# Patient Record
Sex: Female | Born: 1995 | Race: White | Hispanic: No | Marital: Single | Smoking: Never smoker
Health system: Southern US, Community
[De-identification: ages and names within clinical notes are randomized; demographics above are authoritative.]

## PROBLEM LIST (undated history)

## (undated) HISTORY — PX: TONSILLECTOMY: SUR1361

## (undated) HISTORY — PX: TYMPANOSTOMY TUBE PLACEMENT: SHX32

---
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
## 2017-01-05 ENCOUNTER — Encounter (HOSPITAL_BASED_OUTPATIENT_CLINIC_OR_DEPARTMENT_OTHER): Payer: Self-pay | Admitting: *Deleted

## 2017-01-05 ENCOUNTER — Emergency Department (HOSPITAL_BASED_OUTPATIENT_CLINIC_OR_DEPARTMENT_OTHER): Payer: BLUE CROSS/BLUE SHIELD

## 2017-01-05 ENCOUNTER — Emergency Department (HOSPITAL_BASED_OUTPATIENT_CLINIC_OR_DEPARTMENT_OTHER)
Admission: EM | Admit: 2017-01-05 | Discharge: 2017-01-05 | Disposition: A | Discharge status: Home / Self Care | Payer: BLUE CROSS/BLUE SHIELD | Attending: Emergency Medicine | Admitting: Emergency Medicine

## 2017-01-05 DIAGNOSIS — R350 Frequency of micturition: Secondary | ICD-10-CM | POA: Diagnosis not present

## 2017-01-05 DIAGNOSIS — R112 Nausea with vomiting, unspecified: Secondary | ICD-10-CM | POA: Insufficient documentation

## 2017-01-05 DIAGNOSIS — N200 Calculus of kidney: Secondary | ICD-10-CM | POA: Insufficient documentation

## 2017-01-05 DIAGNOSIS — M545 Low back pain: Secondary | ICD-10-CM | POA: Insufficient documentation

## 2017-01-05 DIAGNOSIS — R1031 Right lower quadrant pain: Secondary | ICD-10-CM | POA: Diagnosis present

## 2017-01-05 LAB — COMPREHENSIVE METABOLIC PANEL
ALT: 18 U/L (ref 14–54)
AST: 23 U/L (ref 15–41)
Albumin: 4.3 g/dL (ref 3.5–5.0)
Alkaline Phosphatase: 66 U/L (ref 38–126)
Anion gap: 14 (ref 5–15)
BUN: 12 mg/dL (ref 6–20)
CHLORIDE: 107 mmol/L (ref 101–111)
CO2: 19 mmol/L — AB (ref 22–32)
CREATININE: 0.93 mg/dL (ref 0.44–1.00)
Calcium: 9.4 mg/dL (ref 8.9–10.3)
GFR calc Af Amer: >60 mL/min (ref >60)
GLUCOSE: 119 mg/dL — AB (ref 65–99)
Potassium: 3.6 mmol/L (ref 3.5–5.1)
SODIUM: 140 mmol/L (ref 135–145)
Total Bilirubin: 0.4 mg/dL (ref 0.3–1.2)
Total Protein: 7.6 g/dL (ref 6.5–8.1)

## 2017-01-05 LAB — CBC WITH DIFFERENTIAL/PLATELET
Basophils Absolute: 0 10*3/uL (ref 0.0–0.1)
Basophils Relative: 0 %
EOS ABS: 0 10*3/uL (ref 0.0–0.7)
EOS PCT: 0 %
HCT: 37.7 % (ref 36.0–46.0)
Hemoglobin: 13 g/dL (ref 12.0–15.0)
LYMPHS ABS: 1.5 10*3/uL (ref 0.7–4.0)
Lymphocytes Relative: 14 %
MCH: 29.1 pg (ref 26.0–34.0)
MCHC: 34.5 g/dL (ref 30.0–36.0)
MCV: 84.3 fL (ref 78.0–100.0)
MONO ABS: 0.5 10*3/uL (ref 0.1–1.0)
MONOS PCT: 5 %
Neutro Abs: 8.5 10*3/uL — ABNORMAL HIGH (ref 1.7–7.7)
Neutrophils Relative %: 81 %
PLATELETS: 289 10*3/uL (ref 150–400)
RBC: 4.47 MIL/uL (ref 3.87–5.11)
RDW: 12.9 % (ref 11.5–15.5)
WBC: 10.5 10*3/uL (ref 4.0–10.5)

## 2017-01-05 LAB — URINALYSIS, ROUTINE W REFLEX MICROSCOPIC
Bilirubin Urine: NEGATIVE
Glucose, UA: NEGATIVE mg/dL
Ketones, ur: 15 mg/dL — AB
Leukocytes, UA: NEGATIVE
Nitrite: NEGATIVE
Protein, ur: NEGATIVE mg/dL
SPECIFIC GRAVITY, URINE: 1.029 (ref 1.005–1.030)
pH: 5.5 (ref 5.0–8.0)

## 2017-01-05 LAB — URINALYSIS, MICROSCOPIC (REFLEX)

## 2017-01-05 LAB — LIPASE, BLOOD: LIPASE: 20 U/L (ref 11–51)

## 2017-01-05 LAB — PREGNANCY, URINE: Preg Test, Ur: NEGATIVE

## 2017-01-05 MED ORDER — SODIUM CHLORIDE 0.9 % IV BOLUS (SEPSIS)
1000.0000 mL | Freq: Once | INTRAVENOUS | Status: AC
  Administered 2017-01-05: 1000 mL via INTRAVENOUS

## 2017-01-05 MED ORDER — ONDANSETRON HCL 4 MG/2ML IJ SOLN
INTRAMUSCULAR | Status: AC
  Administered 2017-01-05: 4 mg via INTRAVENOUS
  Filled 2017-01-05: qty 2

## 2017-01-05 MED ORDER — ONDANSETRON 4 MG PO TBDP
4.0000 mg | ORAL_TABLET | Freq: Once | ORAL | Status: AC
  Administered 2017-01-05: 4 mg via ORAL
  Filled 2017-01-05: qty 1

## 2017-01-05 MED ORDER — ONDANSETRON 8 MG PO TBDP: 8.0000 mg | ORAL_TABLET | Freq: Three times a day (TID) | ORAL | 0 refills | Status: AC | PRN

## 2017-01-05 MED ORDER — KETOROLAC TROMETHAMINE 30 MG/ML IJ SOLN
30.0000 mg | Freq: Once | INTRAMUSCULAR | Status: AC
  Administered 2017-01-05: 30 mg via INTRAVENOUS
  Filled 2017-01-05: qty 1

## 2017-01-05 MED ORDER — IOPAMIDOL (ISOVUE-300) INJECTION 61%
100.0000 mL | Freq: Once | INTRAVENOUS | Status: AC | PRN
  Administered 2017-01-05: 100 mL via INTRAVENOUS

## 2017-01-05 MED ORDER — OXYCODONE-ACETAMINOPHEN 5-325 MG PO TABS
2.0000 | ORAL_TABLET | Freq: Once | ORAL | Status: AC
  Administered 2017-01-05: 2 via ORAL
  Filled 2017-01-05: qty 2

## 2017-01-05 MED ORDER — OXYCODONE-ACETAMINOPHEN 5-325 MG PO TABS: 1.0000 | ORAL_TABLET | Freq: Once | ORAL | Status: DC

## 2017-01-05 MED ORDER — ONDANSETRON 4 MG PO TBDP: 4.0000 mg | ORAL_TABLET | Freq: Once | ORAL | Status: DC

## 2017-01-05 MED ORDER — ONDANSETRON HCL 4 MG/2ML IJ SOLN
4.0000 mg | Freq: Once | INTRAMUSCULAR | Status: AC
  Administered 2017-01-05: 4 mg via INTRAVENOUS

## 2017-01-05 MED ORDER — OXYCODONE-ACETAMINOPHEN 5-325 MG PO TABS: 2.0000 | ORAL_TABLET | ORAL | 0 refills | Status: AC | PRN

## 2017-01-05 MED ORDER — MORPHINE SULFATE (PF) 4 MG/ML IV SOLN
4.0000 mg | Freq: Once | INTRAVENOUS | Status: AC
  Administered 2017-01-05: 4 mg via INTRAVENOUS
  Filled 2017-01-05: qty 1

## 2017-01-05 MED ORDER — MORPHINE SULFATE (PF) 4 MG/ML IV SOLN: 4.0000 mg | Freq: Once | INTRAVENOUS | Status: DC

## 2017-01-05 MED ORDER — FENTANYL CITRATE (PF) 100 MCG/2ML IJ SOLN
INTRAMUSCULAR | Status: AC
  Administered 2017-01-05: 50 ug
  Filled 2017-01-05: qty 2

## 2017-01-05 NOTE — Discharge Instructions (Signed)
Drink plenty of water to stay well-hydrated. May use ibuprofen, naproxen, or Tylenol for pain. Percocet for severe pain. Do not drive or perform other dangerous activities while taking the Percocet. Zofran for nausea/vomiting. Follow up with urology as soon as possible on this matter. Call the number provided to set up an appointment. Should symptoms worsen or pain or vomiting is unable to be controlled, please proceed to the emergency department at [facility].

## 2017-01-05 NOTE — ED Triage Notes (Signed)
Pt c/o right flank pain n/v  x 1 day

## 2017-01-05 NOTE — ED Provider Notes (Signed)
[facility]   CSN: [id number] Arrival date & time: 01/05/17  1734  By signing my name below, I, Carmen Evans, attest that this documentation has been prepared under the direction and in the presence of Lakota Markgraf, PA-C. Electronically Signed: Thelma Evans, Scribe. 01/05/17. 6:22 PM.  History   Chief Complaint Chief Complaint  Patient presents with  . Flank Pain   The history is provided by the patient. No language interpreter was used.    HPI Comments: Carmen Evans is a 21 y.o. female who presents to the Emergency Department complaining of constant, gradually worsening, sharp right-sided right lower back pain that began around 9AM this morning. She states the pain is 7/10 and radiates into her right flank and lower right and central abdomen. She has associated nausea, vomiting, urinary frequency, and a feeling of "needles in the vaginal region." She has had similar, but less intense, symptoms with a previous kidney stone. Denies dysuria, hematuria, diarrhea, fever, or any other complaints.   History reviewed. No pertinent past medical history.  There are no active problems to display for this patient.   Past Surgical History:  Procedure Laterality Date  . TONSILLECTOMY    . TYMPANOSTOMY TUBE PLACEMENT      OB History    No data available       Home Medications    Prior to Admission medications   Medication Sig Start Date End Date Taking? Authorizing Provider  ondansetron (ZOFRAN-ODT) 8 MG disintegrating tablet Take 1 tablet (8 mg total) by mouth every 8 (eight) hours as needed for nausea or vomiting. 01/05/17   Jatia Musa C, PA-C  oxyCODONE-acetaminophen (PERCOCET/ROXICET) 5-325 MG tablet Take 2 tablets by mouth every 4 (four) hours as needed for severe pain. 01/05/17 01/10/17  Anselm Pancoast, PA-C    Family History No family history on file.  Social History Social History  Substance Use Topics  . Smoking status: Never Smoker  . Smokeless  tobacco: Not on file  . Alcohol use No     Allergies   Patient has no known allergies.   Review of Systems Review of Systems  Constitutional: Negative for chills, diaphoresis and fever.  Gastrointestinal: Positive for abdominal pain, nausea and vomiting. Negative for diarrhea.  Genitourinary: Positive for flank pain and frequency. Negative for dysuria and hematuria.  All other systems reviewed and are negative.    Physical Exam Updated Vital Signs BP 117/89   Pulse (!) 106   Resp 16   Ht 5\' 1"  (1.549 m)   Wt 165 lb (74.8 kg)   LMP 12/06/2016   SpO2 100%   BMI 31.18 kg/m   Physical Exam  Constitutional: She appears well-developed and well-nourished. She appears distressed.  Patient frequently changes positions in an attempt to get comfortable. She shows signs of clearly being in pain.  HENT:  Head: Normocephalic and atraumatic.  Eyes: Conjunctivae are normal.  Neck: Neck supple.  Cardiovascular: Normal rate, regular rhythm, normal heart sounds and intact distal pulses.   Pulmonary/Chest: Effort normal and breath sounds normal. No respiratory distress.  Abdominal: Soft. There is tenderness in the right lower quadrant and periumbilical area. There is CVA tenderness (right). There is no guarding.  Right flank tenderness  Musculoskeletal: She exhibits no edema.  Lymphadenopathy:    She has no cervical adenopathy.  Neurological: She is alert.  Skin: Skin is warm and dry. She is not diaphoretic.  Psychiatric: She has a normal mood and affect. Her behavior is normal.  Nursing note and vitals reviewed.    ED Treatments / Results  DIAGNOSTIC STUDIES: Oxygen Saturation is 100% on RA, normal by my interpretation.    COORDINATION OF CARE: 6:37 PM Discussed treatment plan with pt at bedside and pt agreed to plan.  Labs (all labs ordered are listed, but only abnormal results are displayed) Labs Reviewed  URINALYSIS, ROUTINE W REFLEX MICROSCOPIC - Abnormal; Notable for  the following:       Result Value   APPearance CLOUDY (*)    Hgb urine dipstick LARGE (*)    Ketones, ur 15 (*)    All other components within normal limits  COMPREHENSIVE METABOLIC PANEL - Abnormal; Notable for the following:    CO2 19 (*)    Glucose, Bld 119 (*)    All other components within normal limits  CBC WITH DIFFERENTIAL/PLATELET - Abnormal; Notable for the following:    Neutro Abs 8.5 (*)    All other components within normal limits  URINALYSIS, MICROSCOPIC (REFLEX) - Abnormal; Notable for the following:    Bacteria, UA FEW (*)    Squamous Epithelial / LPF 6-30 (*)    All other components within normal limits  PREGNANCY, URINE  LIPASE, BLOOD    EKG  EKG Interpretation None       Radiology Ct Abdomen Pelvis W Contrast  Result Date: 01/05/2017 CLINICAL DATA:  Right lower quadrant pain for 1 day EXAM: CT ABDOMEN AND PELVIS WITH CONTRAST TECHNIQUE: Multidetector CT imaging of the abdomen and pelvis was performed using the standard protocol following bolus administration of intravenous contrast. CONTRAST:  100mL ISOVUE-300 IOPAMIDOL (ISOVUE-300) INJECTION 61% COMPARISON:  None. FINDINGS: Lower chest: No acute abnormality. Hepatobiliary: No focal liver abnormality is seen. No gallstones, gallbladder wall thickening, or biliary dilatation. Pancreas: Unremarkable. No pancreatic ductal dilatation or surrounding inflammatory changes. Spleen: Normal in size without focal abnormality. Adrenals/Urinary Tract: The adrenal glands are normal. There is delayed enhancement of the right kidney relative to the left kidney. There is right hydroureteronephrosis. There is a 2 mm calcific density identified in the right pelvis, which may represent a distal ureteral stone. There is no left hydronephrosis. The bladder is normal. Stomach/Bowel: Stomach is within normal limits. Appendix appears normal. No evidence of bowel wall thickening, distention, or inflammatory changes. Vascular/Lymphatic: No  significant vascular findings are present. No enlarged abdominal or pelvic lymph nodes. Reproductive: Uterus and bilateral adnexa are unremarkable. Other: No abdominal wall hernia or abnormality. No abdominopelvic ascites. Musculoskeletal: No acute or significant osseous findings. IMPRESSION: Right hydroureteronephrosis with delayed enhancement of the right kidney relative to the left kidney consistent with obstruction. There is a 2 mm calcific density identified in the right pelvis which may represent a distal ureteral stone. The appendix is normal. Electronically Signed   By: Sherian ReinWei-Chen  Lin M.D.   On: 01/05/2017 20:59    Procedures Procedures (including critical care time)  Medications Ordered in ED Medications  ondansetron (ZOFRAN-ODT) disintegrating tablet 4 mg (4 mg Oral Given 01/05/17 1744)  fentaNYL (SUBLIMAZE) 100 MCG/2ML injection (50 mcg  Given 01/05/17 1758)  sodium chloride 0.9 % bolus 1,000 mL (0 mLs Intravenous Stopped 01/05/17 1902)  morphine 4 MG/ML injection 4 mg (4 mg Intravenous Given 01/05/17 1818)  ondansetron (ZOFRAN) injection 4 mg (4 mg Intravenous Given 01/05/17 1818)  ketorolac (TORADOL) 30 MG/ML injection 30 mg (30 mg Intravenous Given 01/05/17 1902)  morphine 4 MG/ML injection 4 mg (4 mg Intravenous Given 01/05/17 1947)  iopamidol (ISOVUE-300) 61 % injection 100 mL (100  mLs Intravenous Contrast Given 01/05/17 2045)  oxyCODONE-acetaminophen (PERCOCET/ROXICET) 5-325 MG per tablet 2 tablet (2 tablets Oral Given 01/05/17 2201)     Initial Impression / Assessment and Plan / ED Course  I have reviewed the triage vital signs and the nursing notes.  Pertinent labs & imaging results that were available during my care of the patient were reviewed by me and considered in my medical decision making (see chart for details).  Clinical Course as of Jan 06 200  Thu Jan 05, 2017  1829 States her pain has improved to about a 5/10 following the first dose of morphine.   [SJ]  [id number] Patient  rates her pain as "an annoying 5/10."   [SJ]  2149 Discussed imaging results and plan of care with patient. States her pain was relieved, but has now returned to a 5/10. She vomited 20 minutes ago.  [SJ]  2250 Patient's pain is moderately controlled with oral medications. She does have some intermittent vomiting. Patient is very much opposed to admission. Shared decision making used regarding admission. States she would like to go home with strict return precautions. [facility] should pain or vomiting become unbearable.   [SJ]    Clinical Course User Index [SJ] Shamarra Warda C, PA-C    Patient presents for complaint of right lower back, right flank, and abdominal pain. Renal stone highly likely, however, due to the patient's area of tenderness on exam, I thought it prudent to rule out appendicitis as well. Appendix normal on CT, but shows 2 mm obstructing stone in distal ureter without signs of infection. Creatinine normal. Attempted to page the urology twice with no response. Urology resident coverage. Was going to notify urology of patient's close office follow up.  The patient was given instructions for home care as well as return precautions. Patient voices understanding of these instructions, accepts the plan, and is comfortable with discharge.  Findings and plan of care discussed with Tilden FossaElizabeth Rees, MD.   Final Clinical Impressions(s) / ED Diagnoses   Final diagnoses:  Kidney stone    New Prescriptions Discharge Medication List as of 01/05/2017 11:01 PM    START taking these medications   Details  ondansetron (ZOFRAN-ODT) 8 MG disintegrating tablet Take 1 tablet (8 mg total) by mouth every 8 (eight) hours as needed for nausea or vomiting., Starting Thu 01/05/2017, Print    oxyCODONE-acetaminophen (PERCOCET/ROXICET) 5-325 MG tablet Take 2 tablets by mouth every 4 (four) hours as needed for severe pain., Starting Thu 01/05/2017, Until Tue 01/10/2017, Print      I  personally performed the services described in this documentation, which was scribed in my presence. The recorded information has been reviewed and is accurate.   Concepcion LivingJoy, Santanna Olenik C, PA-C 01/06/17 0202    Tilden Fossaees, Elizabeth, MD 01/06/17 [id number]

## 2017-01-05 NOTE — ED Notes (Signed)
ED Provider at bedside. 

## 2019-02-21 IMAGING — CT CT ABD-PELV W/ CM
2 of 4 series · 16 of 46 positions shown, 18 images · IV contrast (APPLIED)
Comparison: None.

CLINICAL DATA: Right lower quadrant pain for 1 day

EXAM:
CT ABDOMEN AND PELVIS WITH CONTRAST
TECHNIQUE: Multidetector CT imaging of the abdomen and pelvis was performed
using the standard protocol following bolus administration of
intravenous contrast.
CONTRAST:  100mL A1PVZ2-M00 IOPAMIDOL (A1PVZ2-M00) INJECTION 61%

[Series 2: axial st · axial · 0.91mm/px · z∈[-443,-18]mm · 13 of 93 slices shown, 15 images]
[im 4/93  soft-tissue]
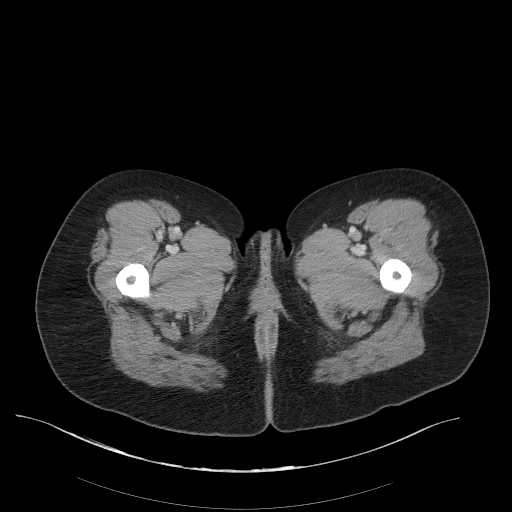
[im 4/93  bone]
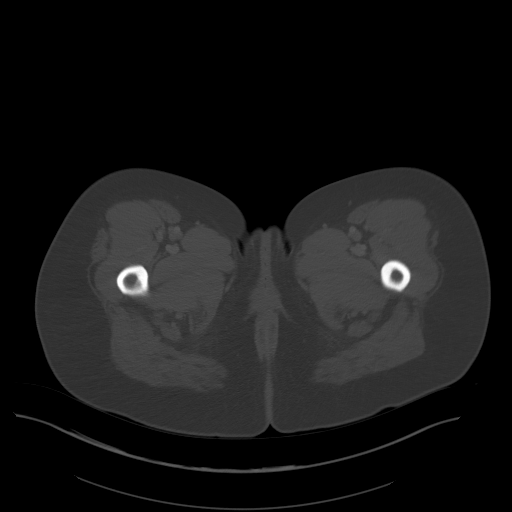
[im 12/93  soft-tissue]
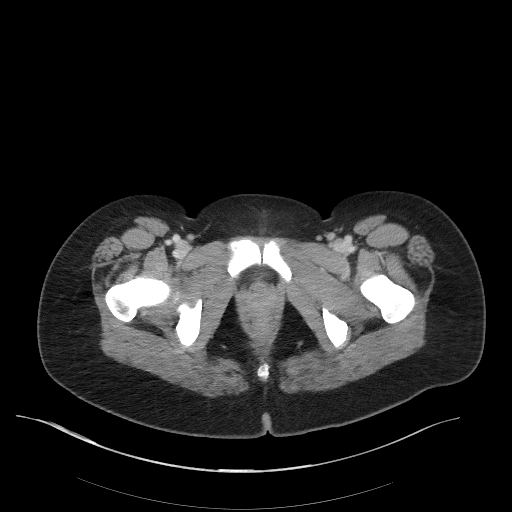
[im 20/93  soft-tissue]
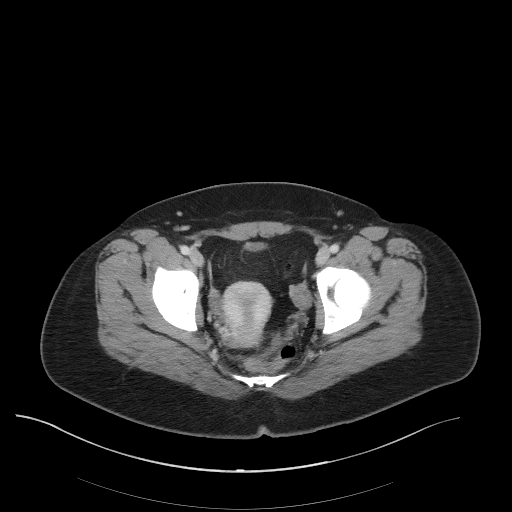
[im 27/93  soft-tissue]
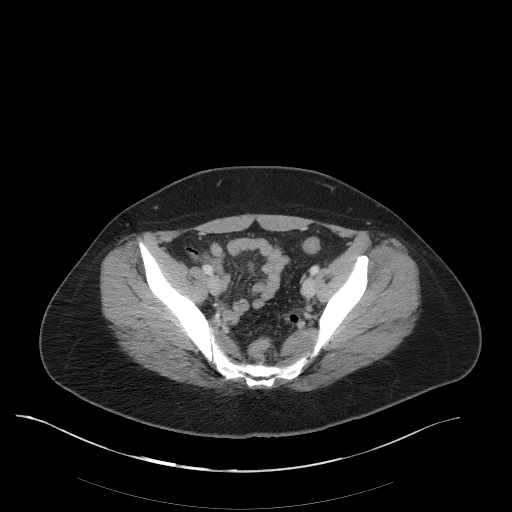
[im 31/93  soft-tissue]
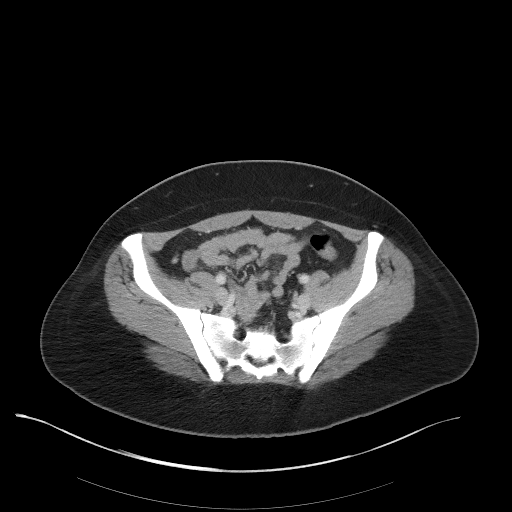
[im 39/93  soft-tissue]
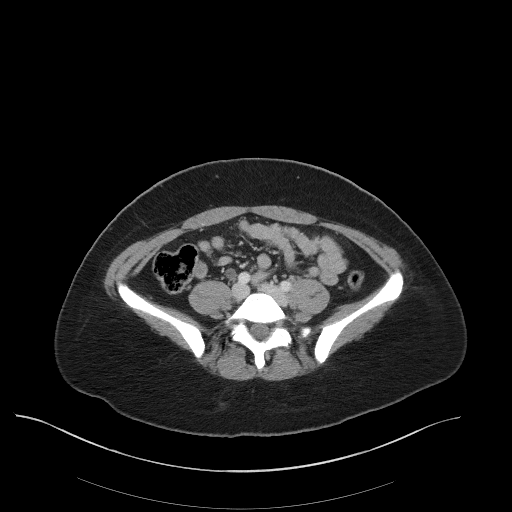
[im 47/93  soft-tissue]
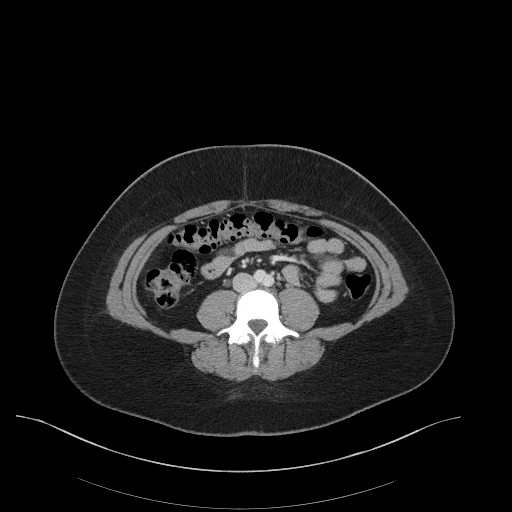
[im 54/93  soft-tissue]
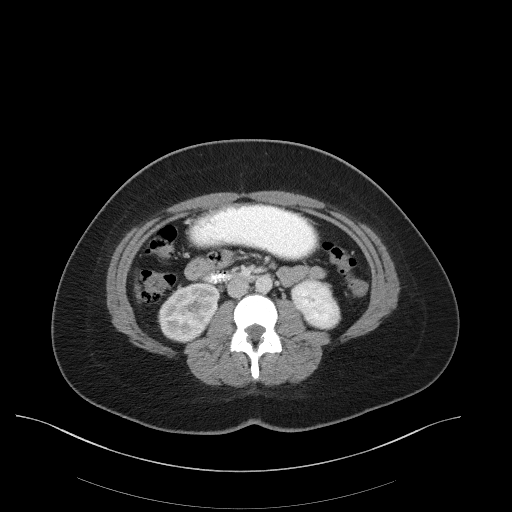
[im 62/93  soft-tissue]
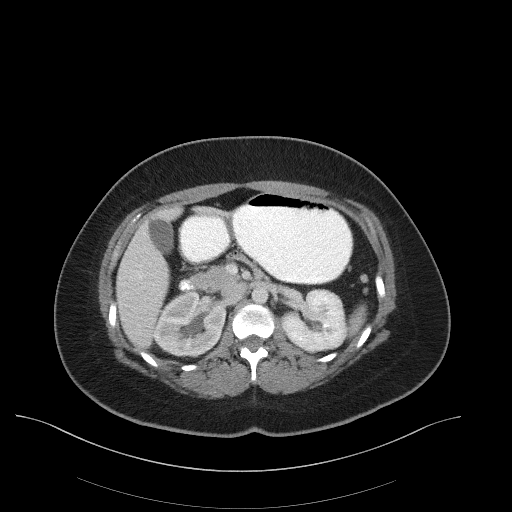
[im 62/93  bone]
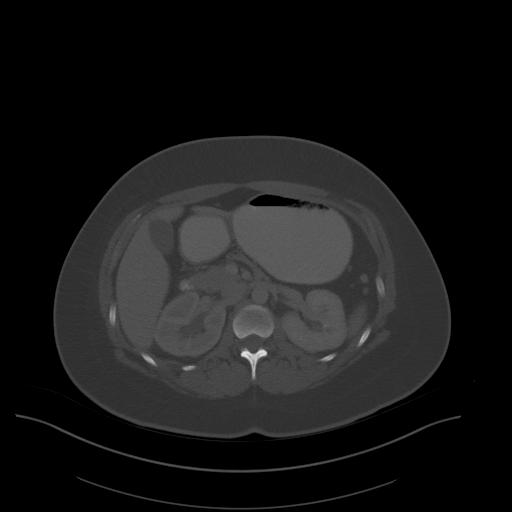
[im 66/93  soft-tissue]
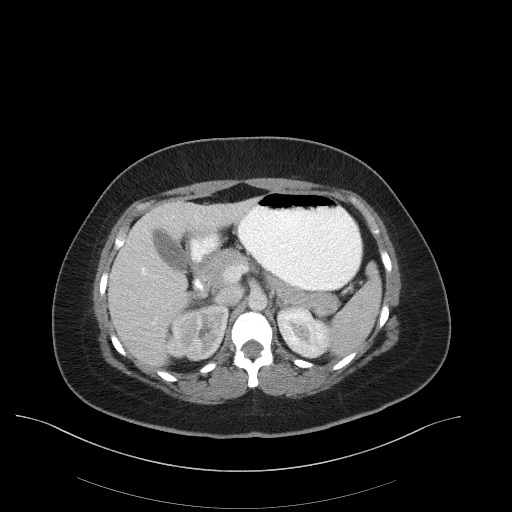
[im 73/93  soft-tissue]
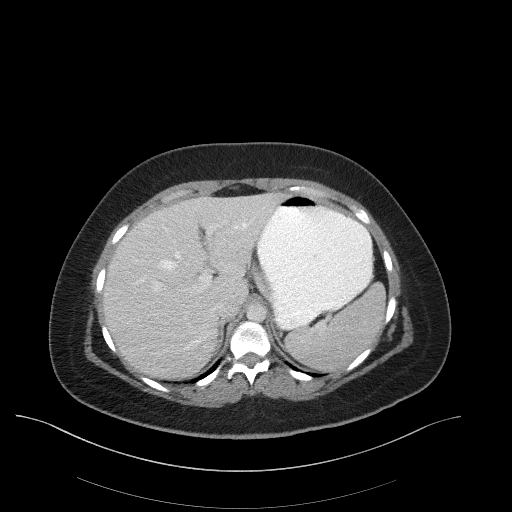
[im 81/93  soft-tissue]
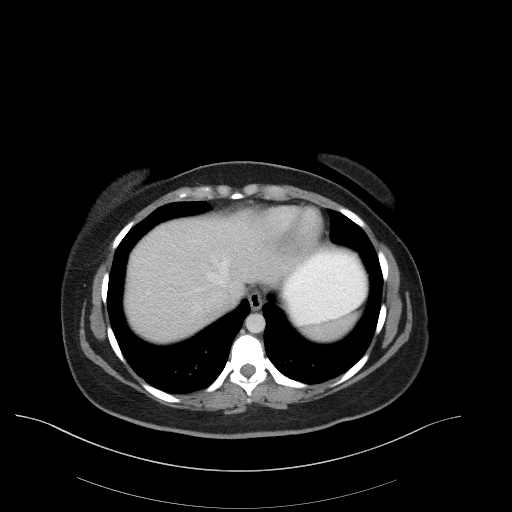
[im 89/93  soft-tissue]
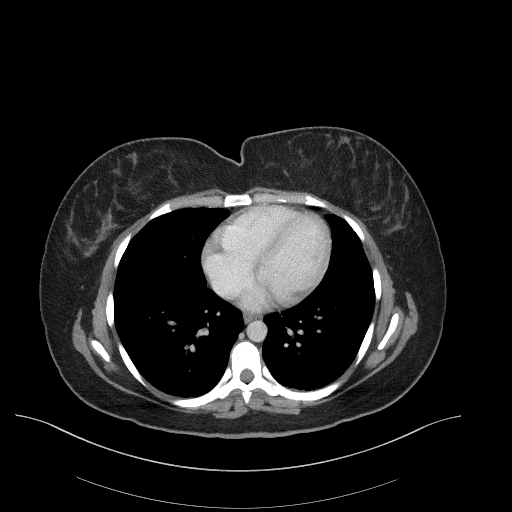

[Series 5: coronal st · coronal · 0.84mm/px · 3 of 88 slices shown]
[im 30/88  soft-tissue]
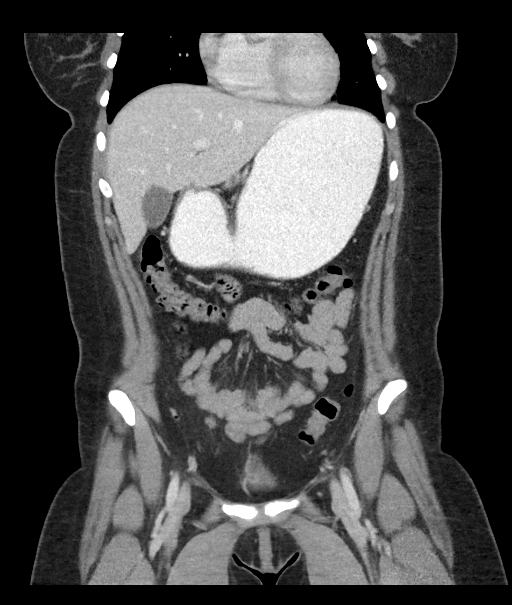
[im 39/88  soft-tissue]
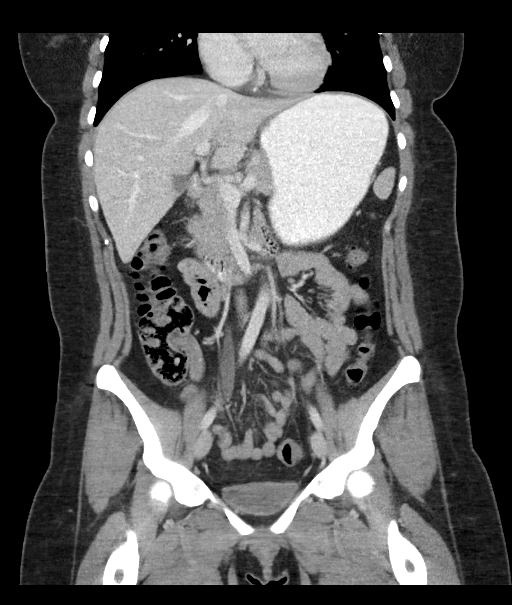
[im 49/88  soft-tissue]
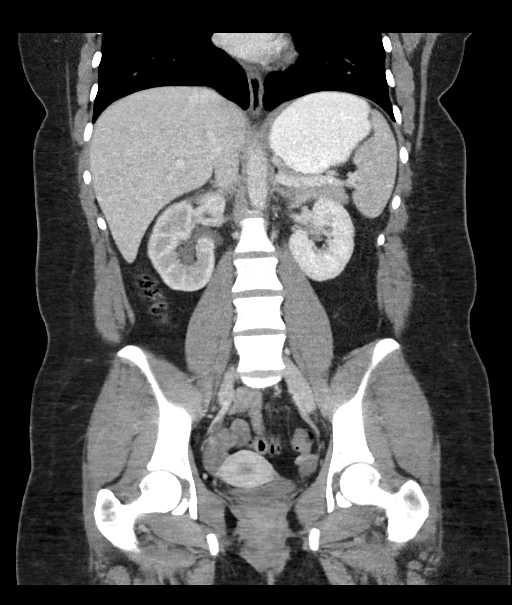

[16 of 46 positions shown; findings below may reference images not displayed]

FINDINGS: Lower chest: No acute abnormality.

Hepatobiliary: No focal liver abnormality is seen. No gallstones,
gallbladder wall thickening, or biliary dilatation.

Pancreas: Unremarkable. No pancreatic ductal dilatation or
surrounding inflammatory changes.

Spleen: Normal in size without focal abnormality.

Adrenals/Urinary Tract: The adrenal glands are normal. There is
delayed enhancement of the right kidney relative to the left kidney.
There is right hydroureteronephrosis. There is a 2 mm calcific
density identified in the right pelvis, which may represent a distal
ureteral stone. There is no left hydronephrosis. The bladder is
normal.

Stomach/Bowel: Stomach is within normal limits. Appendix appears
normal. No evidence of bowel wall thickening, distention, or
inflammatory changes.

Vascular/Lymphatic: No significant vascular findings are present. No
enlarged abdominal or pelvic lymph nodes.

Reproductive: Uterus and bilateral adnexa are unremarkable.

Other: No abdominal wall hernia or abnormality. No abdominopelvic
ascites.

Musculoskeletal: No acute or significant osseous findings.
IMPRESSION: Right hydroureteronephrosis with delayed enhancement of the right
kidney relative to the left kidney consistent with obstruction.
There is a 2 mm calcific density identified in the right pelvis
which may represent a distal ureteral stone.

The appendix is normal.

## 2019-09-13 ENCOUNTER — Ambulatory Visit: Payer: BC Managed Care – PPO | Attending: Internal Medicine

## 2019-09-13 DIAGNOSIS — Z23 Encounter for immunization: Secondary | ICD-10-CM

## 2019-09-13 NOTE — Progress Notes (Signed)
   Covid-19 Vaccination Clinic  Name:  Carmen Evans    MRN: [id number] DOB: 02/21/1996  09/13/2019  Carmen Evans was observed post Covid-19 immunization for 15 minutes without incident. She was provided with Vaccine Information Sheet and instruction to access the V-Safe system.   Carmen Evans was instructed to call 911 with any severe reactions post vaccine: Marland Kitchen Difficulty breathing  . Swelling of face and throat  . A fast heartbeat  . A bad rash all over body  . Dizziness and weakness   Immunizations Administered    Name Date Dose VIS Date Route   Pfizer COVID-19 Vaccine 09/13/2019  3:48 PM 0.3 mL 05/24/2019 Intramuscular   Manufacturer: [employer], [employer]   Lot: [id number]   NDC: [address]-3

## 2019-10-07 ENCOUNTER — Ambulatory Visit: Payer: BC Managed Care – PPO | Attending: Internal Medicine

## 2019-10-07 DIAGNOSIS — Z23 Encounter for immunization: Secondary | ICD-10-CM

## 2019-10-07 NOTE — Progress Notes (Addendum)
   Covid-19 Vaccination Clinic  Name:  Carmen Evans    MRN: [id number] DOB: 1996-04-07  10/07/2019  [name] was observed post Covid-19 immunization for without incident. She was provided with Vaccine Information Sheet and instruction to access the V-Safe system.   [name] was instructed to call 911 with any severe reactions post vaccine: Marland Kitchen Difficulty breathing  . Swelling of face and throat  . A fast heartbeat  . A bad rash all over body  . Dizziness and weakness   Immunizations Administered    Name Date Dose VIS Date Route   Pfizer COVID-19 Vaccine 10/07/2019  2:50 PM 0.3 mL 08/07/2018 Intramuscular   Manufacturer: [employer], [employer]   Lot: [id number]   NDC: [address]-2
